# Patient Record
Sex: Female | Born: 1965 | Race: White | Hispanic: No | Marital: Married | Smoking: Never smoker
Health system: Southern US, Community
[De-identification: ages and names within clinical notes are randomized; demographics above are authoritative.]

## PROBLEM LIST (undated history)

## (undated) DIAGNOSIS — E039 Hypothyroidism, unspecified: Secondary | ICD-10-CM

## (undated) DIAGNOSIS — Z800 Family history of malignant neoplasm of digestive organs: Secondary | ICD-10-CM

## (undated) HISTORY — DX: Hypothyroidism, unspecified: E03.9

## (undated) HISTORY — DX: Family history of malignant neoplasm of digestive organs: Z80.0

---

## 1997-10-14 ENCOUNTER — Ambulatory Visit (HOSPITAL_COMMUNITY): Admission: RE | Admit: 1997-10-14 | Discharge: 1997-10-14 | Payer: Self-pay | Admitting: *Deleted

## 2000-10-03 ENCOUNTER — Encounter: Admission: RE | Admit: 2000-10-03 | Discharge: 2000-10-03 | Payer: Self-pay | Admitting: *Deleted

## 2000-10-03 ENCOUNTER — Encounter: Payer: Self-pay | Admitting: *Deleted

## 2001-07-01 ENCOUNTER — Other Ambulatory Visit: Admission: RE | Admit: 2001-07-01 | Discharge: 2001-07-01 | Payer: Self-pay | Admitting: *Deleted

## 2003-12-15 ENCOUNTER — Other Ambulatory Visit: Admission: RE | Admit: 2003-12-15 | Discharge: 2003-12-15 | Payer: Self-pay | Admitting: Family Medicine

## 2003-12-20 ENCOUNTER — Encounter: Admission: RE | Admit: 2003-12-20 | Discharge: 2003-12-20 | Payer: Self-pay | Admitting: Family Medicine

## 2004-01-03 HISTORY — PX: THYROIDECTOMY: SHX17

## 2004-01-05 ENCOUNTER — Encounter (INDEPENDENT_AMBULATORY_CARE_PROVIDER_SITE_OTHER): Payer: Self-pay | Admitting: Specialist

## 2004-01-05 ENCOUNTER — Ambulatory Visit (HOSPITAL_COMMUNITY): Admission: RE | Admit: 2004-01-05 | Discharge: 2004-01-05 | Payer: Self-pay | Admitting: Family Medicine

## 2004-02-07 ENCOUNTER — Observation Stay (HOSPITAL_COMMUNITY): Admission: RE | Admit: 2004-02-07 | Discharge: 2004-02-08 | Payer: Self-pay | Admitting: General Surgery

## 2004-02-07 ENCOUNTER — Encounter (INDEPENDENT_AMBULATORY_CARE_PROVIDER_SITE_OTHER): Payer: Self-pay | Admitting: Specialist

## 2005-04-04 HISTORY — PX: COLONOSCOPY: SHX174

## 2005-05-27 ENCOUNTER — Other Ambulatory Visit: Admission: RE | Admit: 2005-05-27 | Discharge: 2005-05-27 | Payer: Self-pay | Admitting: Family Medicine

## 2005-09-17 DIAGNOSIS — D229 Melanocytic nevi, unspecified: Secondary | ICD-10-CM

## 2005-09-17 HISTORY — DX: Melanocytic nevi, unspecified: D22.9

## 2005-10-28 ENCOUNTER — Ambulatory Visit: Payer: Self-pay | Admitting: Family Medicine

## 2006-10-31 IMAGING — CR DG CHEST 2V
3 series · 3 of 3 positions shown · non-contrast
Comparison: 12/20/03.

CLINICAL DATA: Preoperative respiratory exam for removal of left thyroid nodule.  
 CHEST - TWO VIEW:

[view not recorded (1 of 3)]
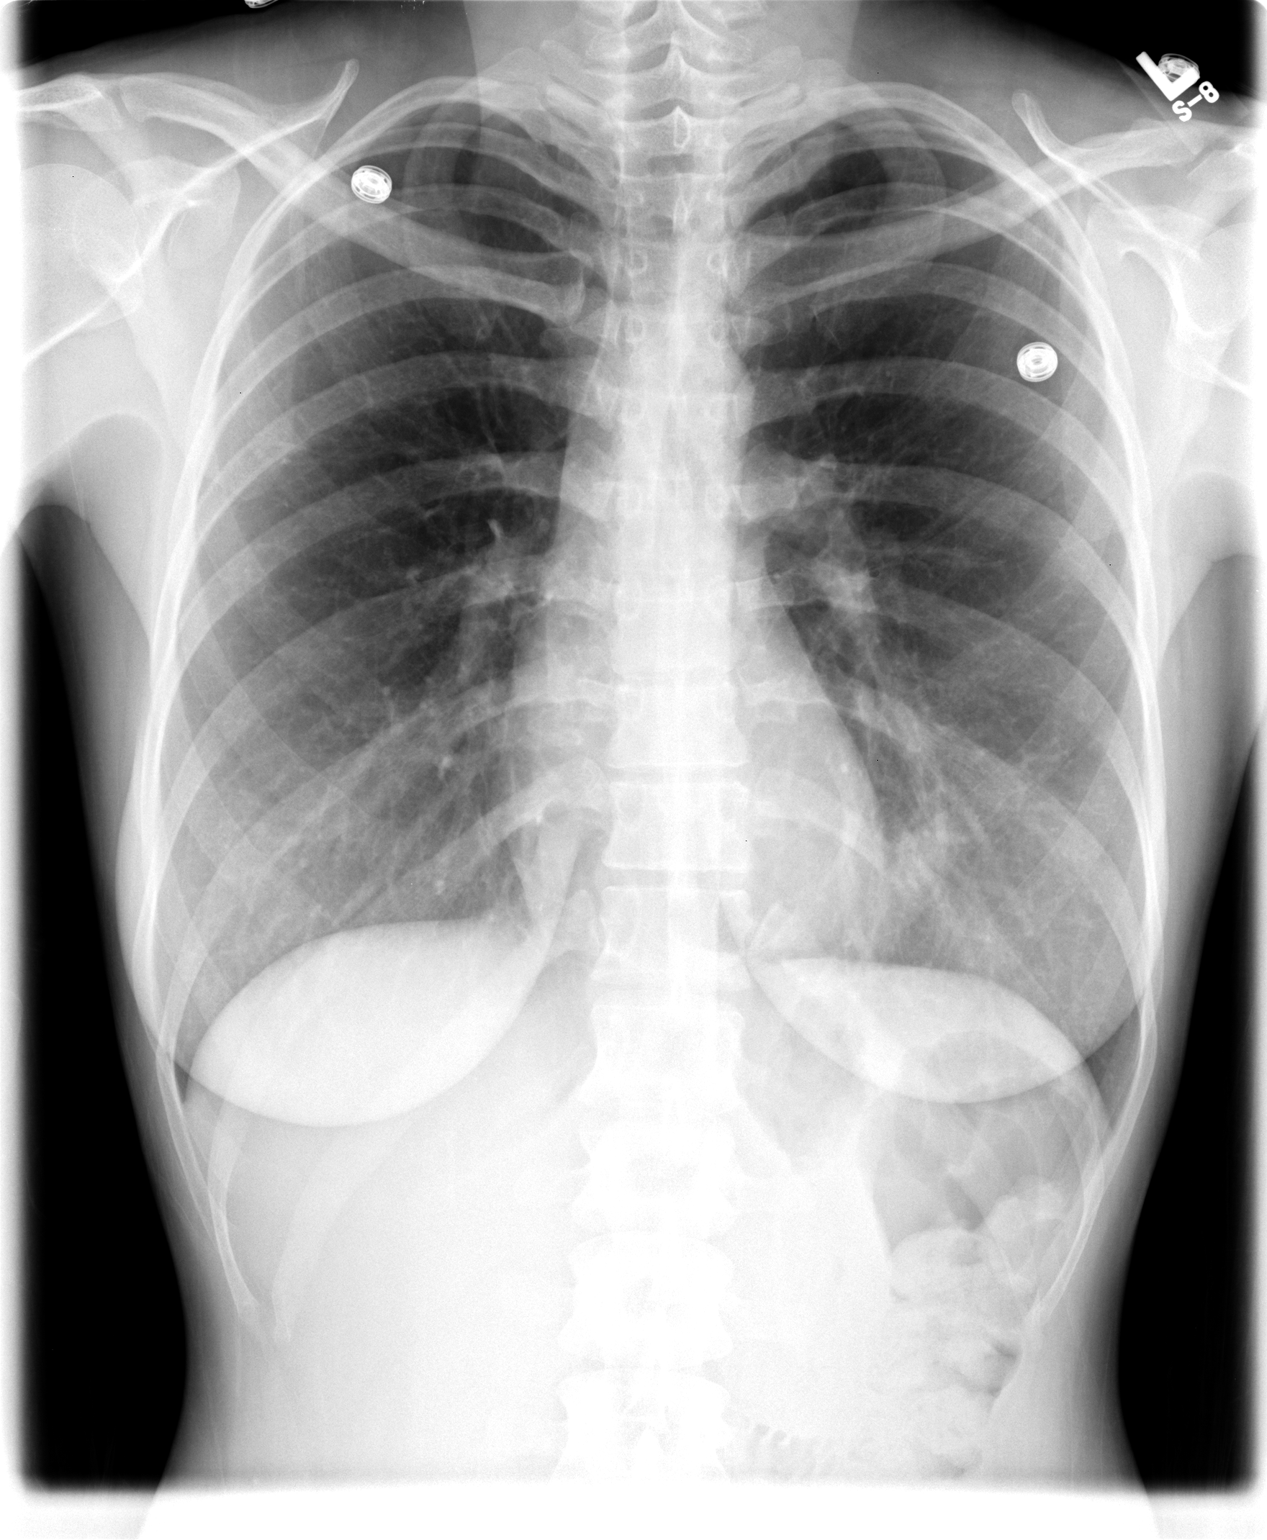

[view not recorded (2 of 3)]
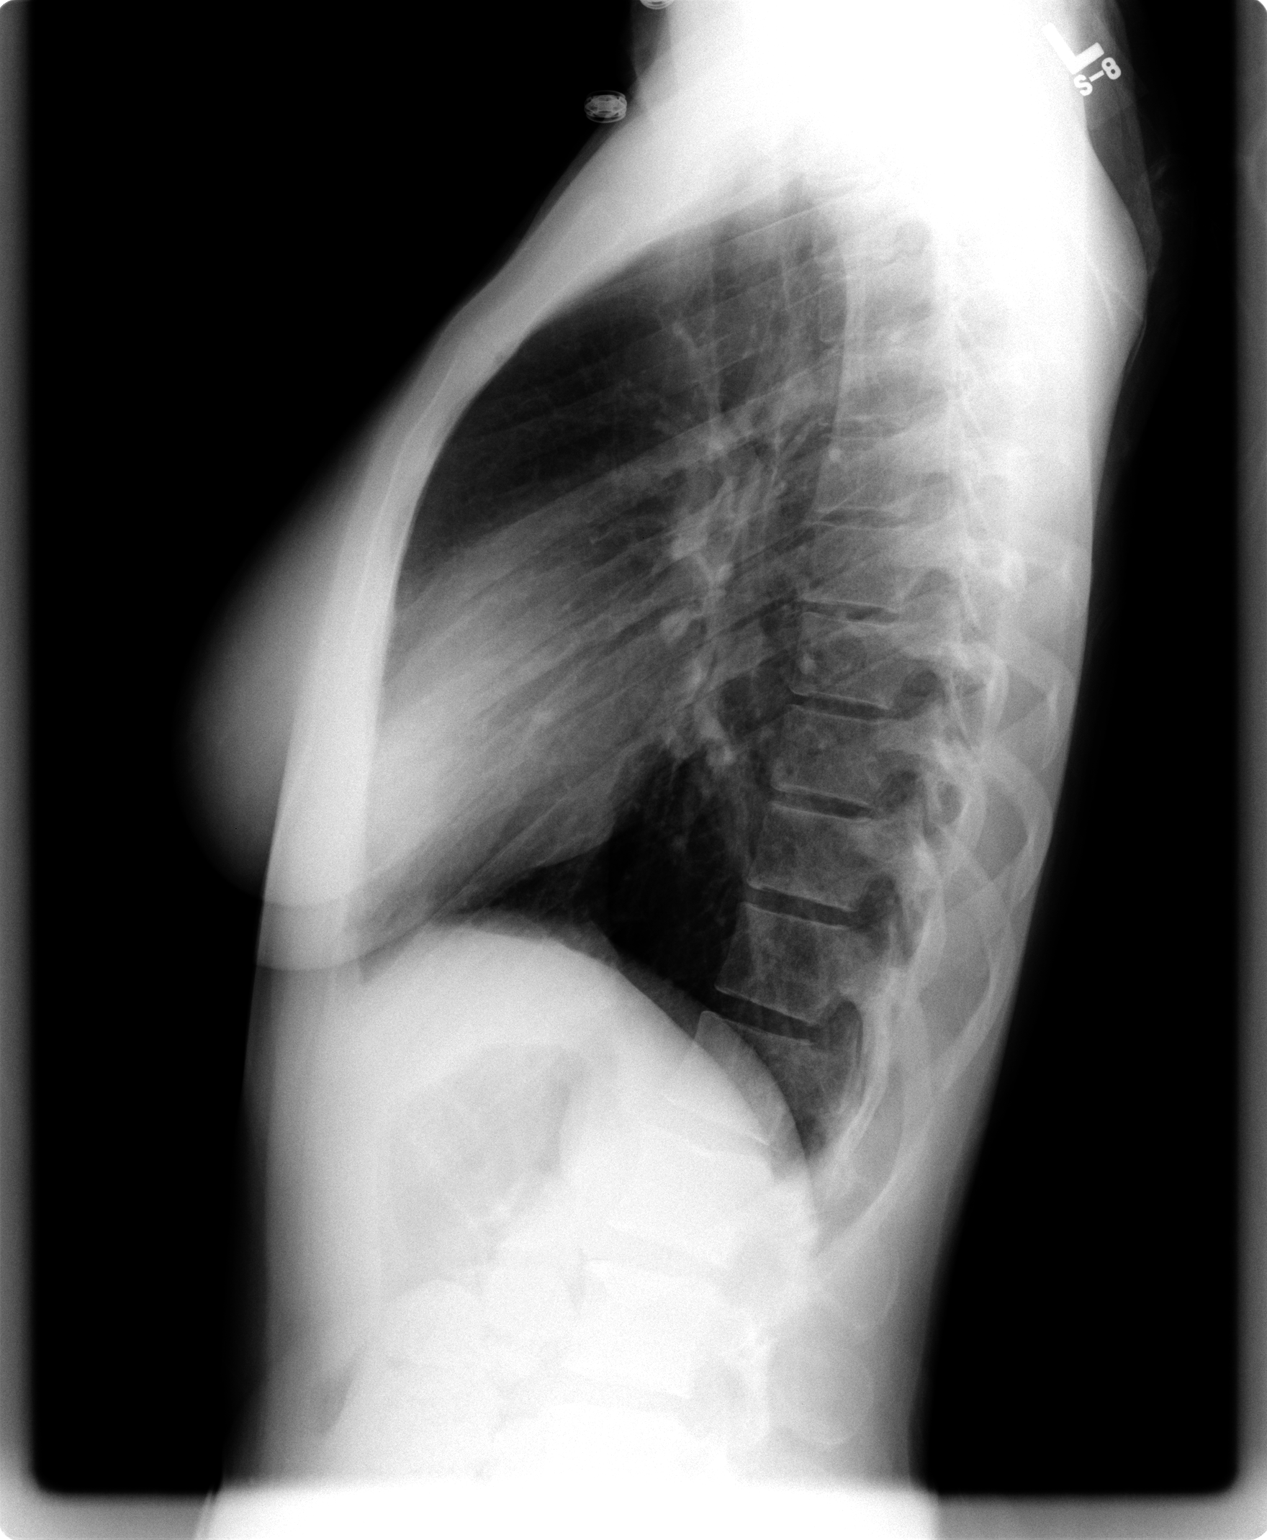

[view not recorded (3 of 3)]
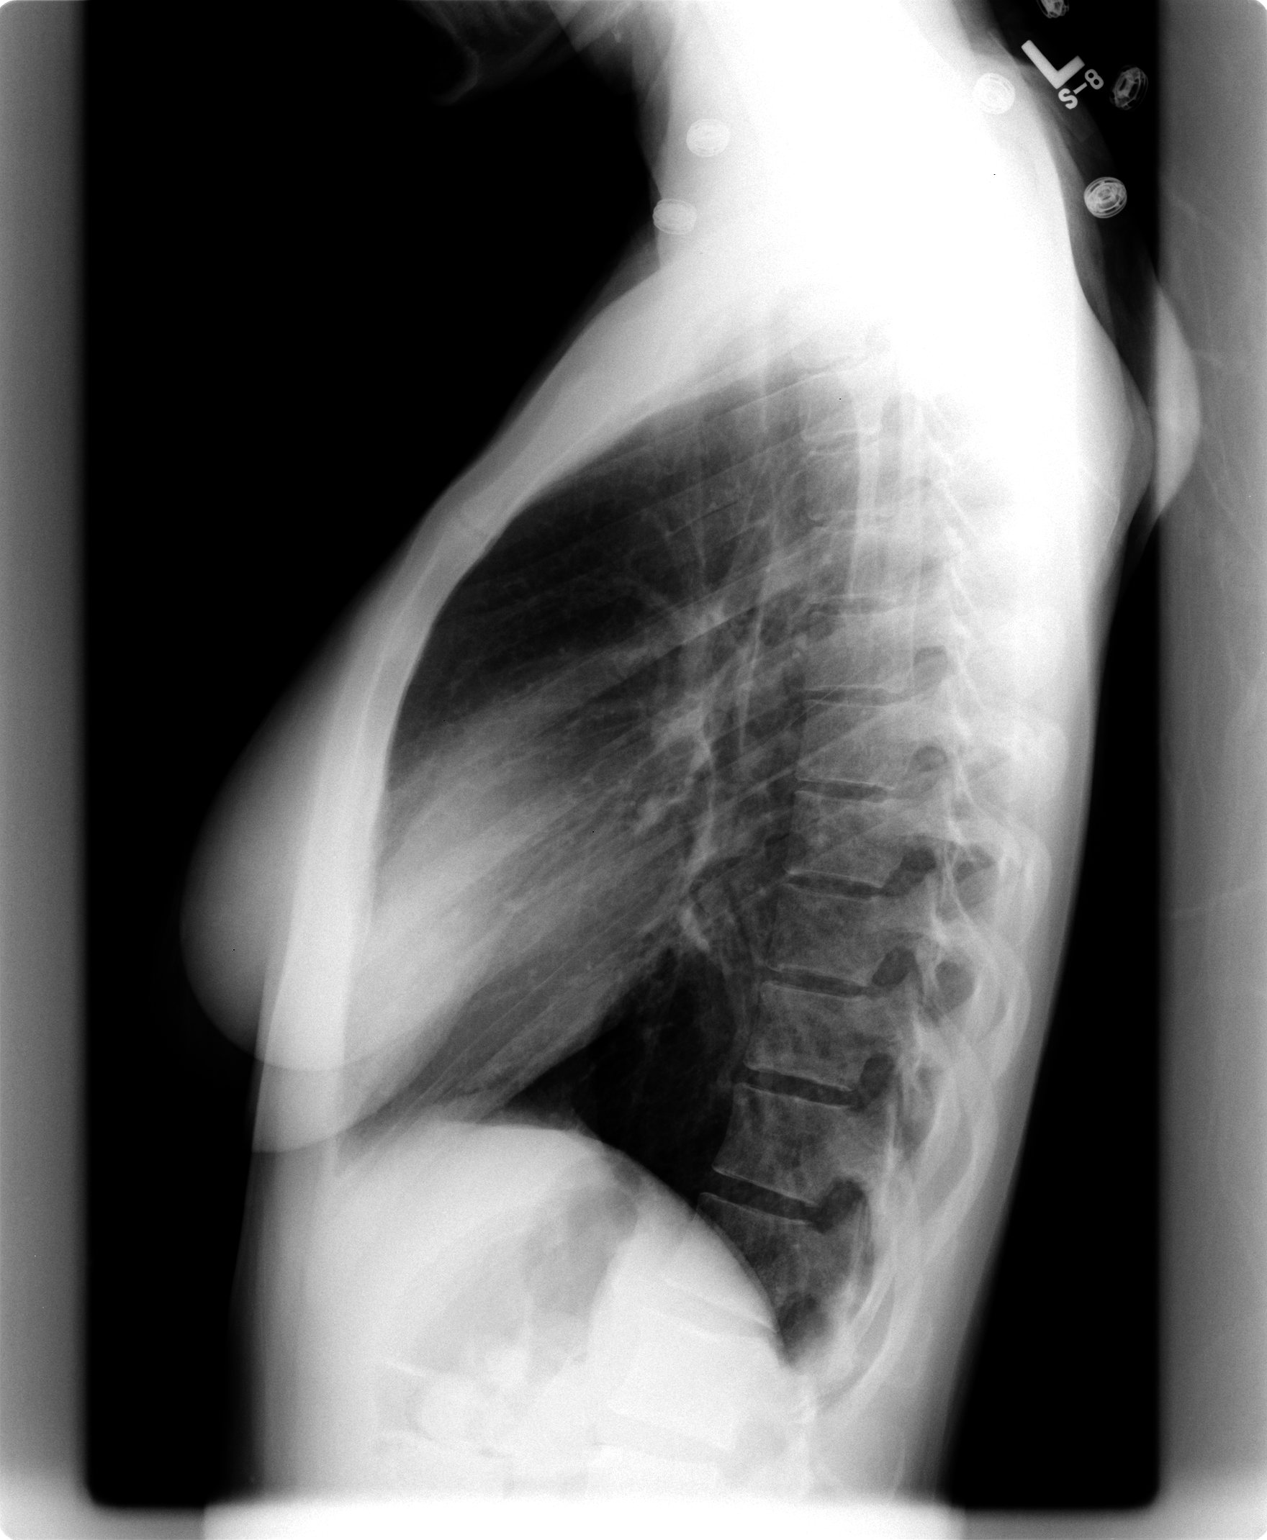

[3 of 3 positions shown; findings below may reference images not displayed]

There is focal scarring versus atelectasis at the left lung base.  No infiltrate, edema, nodule or pleural fluid is seen.  The heart size and mediastinal contours are within normal limits.  Visualized bony thorax is unremarkable.
IMPRESSION: No active disease.  Left basilar scarring/atelectasis.

## 2006-11-26 ENCOUNTER — Ambulatory Visit: Payer: Self-pay | Admitting: Family Medicine

## 2006-12-05 ENCOUNTER — Ambulatory Visit: Payer: Self-pay | Admitting: Hematology and Oncology

## 2006-12-17 LAB — CBC WITH DIFFERENTIAL/PLATELET
BASO%: 0.9 % (ref 0.0–2.0)
Eosinophils Absolute: 0.2 10*3/uL (ref 0.0–0.5)
HGB: 14 g/dL (ref 11.6–15.9)
MCH: 33.9 pg (ref 26.0–34.0)
MONO#: 0.5 10*3/uL (ref 0.1–0.9)
NEUT#: 3.4 10*3/uL (ref 1.5–6.5)
NEUT%: 57.3 % (ref 39.6–76.8)
RBC: 4.13 10*6/uL (ref 3.70–5.32)
RDW: 12.1 % (ref 11.3–14.5)
WBC: 5.9 10*3/uL (ref 3.9–10.0)
lymph#: 1.8 10*3/uL (ref 0.9–3.3)

## 2006-12-17 LAB — COMPREHENSIVE METABOLIC PANEL
AST: 20 U/L (ref 0–37)
Alkaline Phosphatase: 53 U/L (ref 39–117)
BUN: 14 mg/dL (ref 6–23)
Glucose, Bld: 83 mg/dL (ref 70–99)
Potassium: 4 mEq/L (ref 3.5–5.3)
Total Bilirubin: 0.5 mg/dL (ref 0.3–1.2)

## 2006-12-17 LAB — CHCC SMEAR

## 2006-12-18 LAB — FOLATE: Folate: >20 ng/mL

## 2006-12-18 LAB — VITAMIN B12: Vitamin B-12: 608 pg/mL (ref 211–911)

## 2007-02-09 ENCOUNTER — Ambulatory Visit: Payer: Self-pay | Admitting: Hematology and Oncology

## 2007-02-11 LAB — CBC WITH DIFFERENTIAL/PLATELET
BASO%: 0.4 % (ref 0.0–2.0)
Basophils Absolute: 0 10*3/uL (ref 0.0–0.1)
EOS%: 1.1 % (ref 0.0–7.0)
Eosinophils Absolute: 0.1 10*3/uL (ref 0.0–0.5)
LYMPH%: 20.3 % (ref 14.0–48.0)
MCHC: 35.2 g/dL (ref 32.0–36.0)
MCV: 95.3 fL (ref 81.0–101.0)
MONO#: 0.4 10*3/uL (ref 0.1–0.9)
RBC: 3.88 10*6/uL (ref 3.70–5.32)
lymph#: 1.4 10*3/uL (ref 0.9–3.3)

## 2007-02-11 LAB — COMPREHENSIVE METABOLIC PANEL
AST: 22 U/L (ref 0–37)
Alkaline Phosphatase: 47 U/L (ref 39–117)
CO2: 23 mEq/L (ref 19–32)
Potassium: 3.9 mEq/L (ref 3.5–5.3)

## 2007-08-31 ENCOUNTER — Inpatient Hospital Stay (HOSPITAL_COMMUNITY): Admission: AD | Admit: 2007-08-31 | Discharge: 2007-09-03 | Payer: Self-pay | Admitting: Obstetrics and Gynecology

## 2007-09-02 ENCOUNTER — Encounter (INDEPENDENT_AMBULATORY_CARE_PROVIDER_SITE_OTHER): Payer: Self-pay | Admitting: Obstetrics and Gynecology

## 2007-09-07 ENCOUNTER — Encounter: Admission: RE | Admit: 2007-09-07 | Discharge: 2007-10-07 | Payer: Self-pay | Admitting: Obstetrics and Gynecology

## 2007-10-08 ENCOUNTER — Encounter: Admission: RE | Admit: 2007-10-08 | Discharge: 2007-11-03 | Payer: Self-pay | Admitting: Obstetrics and Gynecology

## 2008-01-18 ENCOUNTER — Ambulatory Visit: Payer: Self-pay | Admitting: Family Medicine

## 2008-08-09 ENCOUNTER — Ambulatory Visit: Payer: Self-pay | Admitting: Family Medicine

## 2008-10-04 ENCOUNTER — Ambulatory Visit: Payer: Self-pay | Admitting: Family Medicine

## 2008-12-22 ENCOUNTER — Ambulatory Visit: Payer: Self-pay | Admitting: Family Medicine

## 2009-01-24 ENCOUNTER — Ambulatory Visit: Payer: Self-pay | Admitting: Family Medicine

## 2009-03-08 ENCOUNTER — Ambulatory Visit: Payer: Self-pay | Admitting: Family Medicine

## 2009-03-08 ENCOUNTER — Encounter: Admission: RE | Admit: 2009-03-08 | Discharge: 2009-03-08 | Payer: Self-pay | Admitting: Family Medicine

## 2009-04-10 ENCOUNTER — Ambulatory Visit: Payer: Self-pay | Admitting: Family Medicine

## 2009-10-31 ENCOUNTER — Ambulatory Visit (HOSPITAL_COMMUNITY): Admission: RE | Admit: 2009-10-31 | Discharge: 2009-10-31 | Payer: Self-pay | Admitting: Obstetrics and Gynecology

## 2009-11-02 DEATH — deceased

## 2010-03-08 ENCOUNTER — Ambulatory Visit
Admission: RE | Admit: 2010-03-08 | Discharge: 2010-03-08 | Payer: Self-pay | Source: Home / Self Care | Attending: Family Medicine | Admitting: Family Medicine

## 2010-03-24 ENCOUNTER — Encounter: Payer: Self-pay | Admitting: Family Medicine

## 2010-03-25 ENCOUNTER — Encounter: Payer: Self-pay | Admitting: Family Medicine

## 2010-03-27 ENCOUNTER — Encounter
Admission: RE | Admit: 2010-03-27 | Discharge: 2010-03-27 | Payer: Self-pay | Source: Home / Self Care | Attending: Family Medicine | Admitting: Family Medicine

## 2010-03-27 ENCOUNTER — Ambulatory Visit
Admission: RE | Admit: 2010-03-27 | Discharge: 2010-03-27 | Payer: Self-pay | Source: Home / Self Care | Attending: Family Medicine | Admitting: Family Medicine

## 2010-05-16 ENCOUNTER — Ambulatory Visit (INDEPENDENT_AMBULATORY_CARE_PROVIDER_SITE_OTHER): Payer: 59 | Admitting: Family Medicine

## 2010-05-16 DIAGNOSIS — E039 Hypothyroidism, unspecified: Secondary | ICD-10-CM

## 2010-05-16 DIAGNOSIS — J309 Allergic rhinitis, unspecified: Secondary | ICD-10-CM

## 2010-05-16 DIAGNOSIS — E559 Vitamin D deficiency, unspecified: Secondary | ICD-10-CM

## 2010-05-18 LAB — CBC
Hemoglobin: 14.4 g/dL (ref 12.0–15.0)
MCH: 34.2 pg — ABNORMAL HIGH (ref 26.0–34.0)
WBC: 4.8 10*3/uL (ref 4.0–10.5)

## 2010-07-17 NOTE — Op Note (Signed)
Judy Mckenzie, Judy Mckenzie              ACCOUNT NO.:  [id number]   MEDICAL RECORD NO.:  [id number]          PATIENT TYPE:  INP   LOCATION:  9174                          FACILITY:  [facility]   PHYSICIAN:  Guy Sandifer. Henderson Cloud, M.D. DATE OF BIRTH:  July 01, 1965   DATE OF PROCEDURE:  09/01/2007  DATE OF DISCHARGE:                               OPERATIVE REPORT   PROCEDURE:  Vacuum extraction.   INDICATIONS AND PROCEDURE:  This patient is a 45 year old married white  female G2 P0 with an EDC of August 27, 2007 who was admitted for induction  of labor on August 31, 2007.  She proceeds to complete and pushing.  Vertex is at the +1 station when she begins the second stage.  After  approximately 3-1/2 hours of pushing, the vertex is +3.  There is good  movement with the push, but the patient is unable to push the vertex via  the perineal body.  Fetal heart tones remain reactive.  Due to lack of  progress, vacuum extraction is discussed with the patient and her  husband.  One in 40,000 risk of severe morbidity or mortality is  reviewed.  All questions were answered.  The patient had been straight  catheterized within the last approximately 30 minutes.  She is set up  and prepped for delivery.  A thin meconium had been noted over the last  several hours.   A kiwi vacuum extractor was placed on the vertex.  There are no pop  offs.  Mild-to-moderate traction is used.  With the second contraction,  the head crowns nicely.  It has then delivered with essentially no  further traction on the vacuum extractor with the patient's own pushing.  After delivery of the vertex, the oronasopharynx were suctioned with the  DeLee suction.  A loose nuchal cord is noted and the baby is delivered.  The cord is reduced after delivery.  Cord is immediately clamped and  cut, and the baby's was handed to the waiting pediatrics team.  No  meconium was noted below the vocal cords by the neonatologist.  Viable  female infant, Apgars of 3  and 9 are obtained.  Birth weight of 7 pounds  13 ounces, and an arterial cord pH of 7.29 is noted.   Placenta is delivered intact and three vessels are noted.  It is sent to  pathology.  Inspection reveals a second-degree midline laceration as  well as a right sulcus laceration, approximately 4 cm in length.  The  patient is then given a sit up dose of her epidural anesthetic from the  anesthesia.  This allows adequate comfort and visualization, and the  sulcus tear is repaired in a running locking fashion with 2-0 Rapide  suture.  The episiotomy is also repaired in a standard fashion.  One  last check reveals the uterus to be involuting well, and there are no  sponges left in the vagina.  The patient and baby are stable in the  labor and delivery room.   ESTIMATED BLOOD LOSS:  500 mL.      Guy Sandifer Henderson Cloud, M.D.  Electronically Signed     JET/MEDQ  D:  09/02/2007  T:  09/02/2007  Job:  [id number]

## 2010-07-20 NOTE — Op Note (Signed)
NAMEBURNA, ATLAS NO.:  [id number]   MEDICAL RECORD NO.:  [id number]          PATIENT TYPE:  OBV   LOCATION:  0373                         FACILITY:  [facility]   PHYSICIAN:  Sharlet Salina T. Hoxworth, M.D.DATE OF BIRTH:  1965/12/25   DATE OF PROCEDURE:  02/07/2004  DATE OF DISCHARGE:                                 OPERATIVE REPORT   PREOPERATIVE DIAGNOSES:  Left thyroid nodule.   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   PROCEDURE:  Total thyroidectomy.   SURGEON:  Lorne Skeens. Hoxworth, M.D.   ASSISTANT:  Lebron Conners, M.D.   ANESTHESIA:  General.   BRIEF HISTORY:  Zanobia Griebel is a 45 year old white female recently found  on physical exam to have an approximately 2 cm nodule in the left lobe of  the thyroid. 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  We have elected to proceed with left  thyroid lobectomy and possible total thyroidectomy. 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 She is now brought to the operating room for this  procedure.   DESCRIPTION OF PROCEDURE:  The patient was brought to the operating room,  placed in supine position on the operating table and general endotracheal  anesthesia was induced. She was carefully positioned with her neck extended  and the neck and upper chest sterilely prepped and draped. A transverse  incision was made in a skin crease two fingerbreadths above the sternal  notch and dissection carried down through the subcutaneous tissue and the  platysma. The subplatysmal flaps were raised superiorly to the thyroid  cartilage, inferiorly toward the sternal notch and laterally at the  sternocleidomastoid muscles.  A Mahorner retractor was placed and the strap  muscles were divided in the midline.  The anterior surface of the left lobe  was then exposed with blunt dissection and the nodule was immediately  apparently which seemed a little less than 2 cm and was quite soft.  The  thyroid gland on the left was mobilized with careful blunt dissection.  The  middle thyroid vein was divided between clips. The superior pole was  dissected and superior pole vessels divided between clamps, tied and  clipped.  The gland was then reflected up medially and we felt that we could  identify the inferior parathyroid gland which was protected. Dissection was  carefully carried out in the tracheoesophageal groove and the recurrent  laryngeal nerve on the left was clearly identified and its course traced up  toward the larynx and dissection kept well medial to this.  Individual  branches of the inferior thyroid artery were then dissected and clipped and  the gland mobilized back to the trachea and suspensory ligament of Allyson Sabal  which was mobilized with cautery. The isthmus was fully mobilized and the  nodule contained well within the gland toward the isthmus. The isthmus was  clamped,  divided and the left lobe sent for frozen section. This did raise  significant suspicion for follicular, ovarian or papillary carcinoma and I  elected to proceed with total thyroidectomy as planned. The right lobe was  exposed identically.  It was quite small.  Upper pole vessels were tied and  then doubly clipped and divided, upper pole fully mobilized. Again noted was  a probable inferior parathyroid gland that was dissected off the thyroid  capsule and preserved. The dissection was kept really just inside the  capsule of the thyroid gland on this side and the recurrent laryngeal nerve  was not exposed but we felt we were well medial to this.  The gland was then  mobilized back in a similar fashion towards  the suspensory ligament which  was divided with cautery and the specimen removed.  Complete hemostasis was  assured in the operative site.  The strap muscles were then closed in the  midline interrupted 4-0 Vicryl.  The plastyma was closed with interrupted 4-  0 Vicryl and the skin with subcuticular 5-0 Monocryl and Steri-Strips.  Sponge, needle and instrument counts were correct.  Dry sterile dressings  were applied and the patient taken to recovery in good condition.     Benj   BTH/MEDQ  D:  02/07/2004  T:  02/07/2004  Job:  [id number]

## 2010-10-09 ENCOUNTER — Other Ambulatory Visit: Payer: Self-pay | Admitting: Physician Assistant

## 2010-11-29 LAB — CBC
MCHC: 34.5
Platelets: 117 — ABNORMAL LOW
Platelets: 127 — ABNORMAL LOW
RBC: 3.37 — ABNORMAL LOW
RBC: 3.34 — ABNORMAL LOW
RDW: 14
WBC: 18.9 — ABNORMAL HIGH

## 2010-12-10 ENCOUNTER — Ambulatory Visit (INDEPENDENT_AMBULATORY_CARE_PROVIDER_SITE_OTHER): Payer: 59 | Admitting: Medical

## 2010-12-10 ENCOUNTER — Encounter: Payer: Self-pay | Admitting: Medical

## 2010-12-10 VITALS — BP 118/70 | HR 60 | Temp 98.1°F | Resp 18 | Ht 63.0 in | Wt 127.0 lb

## 2010-12-10 DIAGNOSIS — R053 Chronic cough: Secondary | ICD-10-CM | POA: Insufficient documentation

## 2010-12-10 DIAGNOSIS — Z800 Family history of malignant neoplasm of digestive organs: Secondary | ICD-10-CM

## 2010-12-10 DIAGNOSIS — R05 Cough: Secondary | ICD-10-CM | POA: Insufficient documentation

## 2010-12-10 MED ORDER — BENZONATATE 200 MG PO CAPS: 200.0000 mg | ORAL_CAPSULE | Freq: Three times a day (TID) | ORAL | Status: AC | PRN

## 2010-12-10 MED ORDER — CETIRIZINE HCL 10 MG PO TABS: 10.0000 mg | ORAL_TABLET | Freq: Every day | ORAL | Status: DC

## 2010-12-10 MED ORDER — OMEPRAZOLE MAGNESIUM 20 MG PO TBEC: 20.0000 mg | DELAYED_RELEASE_TABLET | Freq: Every day | ORAL | Status: DC

## 2010-12-10 NOTE — Patient Instructions (Signed)
Use Tessalon Perles up to 3 times daily for cough suppression.  Begin OTC Zyrtec at bedtime once daily for post nasal drip.  Begin OTC Prilosec samples one time daily 45 minutes before breakfast along with your thyroid medication.    Lets recheck in 3-4 weeks.

## 2010-12-10 NOTE — Progress Notes (Signed)
  Subjective:   HPI  Judy Mckenzie is a 45 y.o. female who presents for c/o cough x 1 month.  She notes in the past that every time she gets a cold she has a prolonged cough.  She denies hx/o asthma, smoking, heart disease, lung disease, allergies, or GERD.  She does note some recent post nasal drip, but otherwise feels fine.  Her daughter had strep throat a month ago, but no other sick contacts.  Took some Nyquil which suppressed cough, but made her feel woozy.  No other aggravating or relieving factors.    No other c/o.  The following portions of the patient's history were reviewed and updated as appropriate: allergies, current medications, past family history, past medical history, past social history, past surgical history and problem list.  Past Medical History  Diagnosis Date  . Hypothyroidism     hx/o thyroid cancer, hx/o thyroidectomy  . Vitamin D deficiency   . Family history of colon cancer     No Known Allergies  Current medications: Synthroid daily.  Review of Systems Constitutional: +fatigue; denies fever, chills, sweats, unexpected weight change ENT: +post nasal drip;  no runny nose, ear pain, sore throat Cardiology: denies chest pain, palpitations, edema Respiratory: denies shortness of breath, wheezing Gastroenterology: denies abdominal pain, nausea, vomiting, diarrhea, constipation, difficulty swallowing Hematology: denies bleeding or bruising problems Musculoskeletal: +mild aches; mild upper back pain at times Urology: denies dysuria, difficulty urinating, hematuria, urinary frequency, urgency Ext: occasional right ankle swelling if having right knee pain or if standing for prolonged periods     Objective:   Physical Exam  General appearance: alert, no distress, WD/WN, white female Skin: unremarkable HEENT: normocephalic, sclerae anicteric, TMs pearly, nares patent, no discharge or erythema, pharynx with mild post nasal drip Oral cavity: MMM, no  lesions Neck: supple, no lymphadenopathy, no thyromegaly, no masses Heart: RRR, normal S1, S2, no murmurs Lungs: CTA bilaterally, no wheezes, rhonchi, or rales Abdomen: +bs, soft, non tender, non distended, no masses, no hepatomegaly, no splenomegaly Pulses: 2+ symmetric, upper and lower extremities, normal cap refill   Assessment :    Encounter Diagnoses  Name Primary?  . Chronic cough Yes  . Family history of colon cancer      Plan:  Chronic cough - discussed long list of possible causes.  Will try OTC Zyrtec nightly and begin Prilosec in the mornings.  Tessalon Perles prn for cough suppression.  If not improving or worse over the next 3-4 wk, will then consider appropriate workup.  I reviewed last labs in chart from 1/12 and 3/12, and reviewed prior CXR from 1/12.  Of note, she had similar cough in March that cleared with Jerilynn Som.  Last colonoscopy was 2007.  I advised she contact Dr. Kenna Gilbert office for advise on next screening colonoscopy given mother and aunt's family history of colon cancer at young age.

## 2011-01-08 ENCOUNTER — Telehealth: Payer: Self-pay | Admitting: Family Medicine

## 2011-01-09 ENCOUNTER — Other Ambulatory Visit: Payer: Self-pay | Admitting: *Deleted

## 2011-01-09 DIAGNOSIS — E039 Hypothyroidism, unspecified: Secondary | ICD-10-CM

## 2011-01-10 ENCOUNTER — Telehealth: Payer: Self-pay | Admitting: *Deleted

## 2011-01-10 MED ORDER — LEVOTHYROXINE SODIUM 100 MCG PO TABS: 100.0000 ug | ORAL_TABLET | Freq: Every day | ORAL | Status: DC

## 2011-01-10 NOTE — Telephone Encounter (Signed)
This was already taken care of by [place] in a separate message (samples given)

## 2011-01-10 NOTE — Telephone Encounter (Signed)
Patient called and asked for 1 week worth of synthroid samples as she is going out of town and her shipment from [employer] will not be at her home until next week. Patient given #7 samples and she is now scheduled for a TSH lab visit on 01/29/11 @ 9:30am, needed per chart.

## 2011-01-18 ENCOUNTER — Encounter: Payer: Self-pay | Admitting: Family Medicine

## 2011-01-29 ENCOUNTER — Other Ambulatory Visit: Payer: 59

## 2011-01-29 DIAGNOSIS — E039 Hypothyroidism, unspecified: Secondary | ICD-10-CM

## 2011-01-29 LAB — TSH: TSH: 1.615 u[IU]/mL (ref 0.350–4.500)

## 2011-01-30 ENCOUNTER — Encounter: Payer: Self-pay | Admitting: Family Medicine

## 2011-03-01 ENCOUNTER — Encounter: Payer: Self-pay | Admitting: Medical

## 2011-03-01 ENCOUNTER — Ambulatory Visit (INDEPENDENT_AMBULATORY_CARE_PROVIDER_SITE_OTHER): Payer: 59 | Admitting: Medical

## 2011-03-01 VITALS — BP 100/70 | HR 68 | Temp 98.4°F | Resp 16 | Wt 130.0 lb

## 2011-03-01 DIAGNOSIS — J40 Bronchitis, not specified as acute or chronic: Secondary | ICD-10-CM

## 2011-03-01 MED ORDER — HYDROCODONE-HOMATROPINE 5-1.5 MG/5ML PO SYRP: 5.0000 mL | ORAL_SOLUTION | Freq: Four times a day (QID) | ORAL | Status: AC | PRN

## 2011-03-01 MED ORDER — AZITHROMYCIN 500 MG PO TABS: 500.0000 mg | ORAL_TABLET | Freq: Every day | ORAL | Status: AC

## 2011-03-01 NOTE — Progress Notes (Signed)
Subjective:   HPI  Judy Mckenzie is a 45 y.o. female who presents with three-week history of cough, had a cold last week, but now it seems like symptoms have moved into her chest. She notes pain with deep breath, chest congestion, pain around her right chest wall, nausea, sore throat, fatigue. She denies fever, vomiting, diarrhea, headache. Using [employer], NyQuil, and Mucinex.  Of note she had a colonoscopy this past week.  No other aggravating or relieving factors.    No other c/o.  The following portions of the patient's history were reviewed and updated as appropriate: allergies, current medications, past family history, past medical history, past social history, past surgical history and problem list.  Past Medical History  Diagnosis Date  . Hypothyroidism     hx/o thyroid cancer, hx/o thyroidectomy  . Vitamin D deficiency   . Family history of colon cancer     Review of Systems Constitutional: -fever, -chills, -sweats, -unexpected -weight change,-fatigue ENT: -runny nose, -ear pain, -sore throat Cardiology:  +chest pain, -palpitations, -edema Respiratory: -cough, -shortness of breath, -wheezing Gastroenterology: -abdominal pain, -nausea, -vomiting, -diarrhea, -constipation Hematology: -bleeding or bruising problems Musculoskeletal: -arthralgias, -myalgias, -joint swelling, -back pain Ophthalmology: -vision changes Urology: -dysuria, -difficulty urinating, -hematuria, -urinary frequency, -urgency Neurology: -headache, -weakness, -tingling, -numbness   Objective:   Filed Vitals:   03/01/11 1644  BP: 100/70  Pulse: 68  Temp: 98.4 F (36.9 C)  Resp: 16    General appearance: Alert, WD/WN, no distress, somewhat ill appearing                             Skin: warm, no rash, no diaphoresis                           Head: no sinus tenderness                            Eyes: conjunctiva normal, corneas clear, PERRLA                            Ears: pearly TMs,  external ear canals normal                          Nose: septum midline, turbinates swollen, with erythema and clear discharge             Mouth/throat: MMM, tongue normal, mild pharyngeal erythema                           Neck: supple, no adenopathy, no thyromegaly, nontender                          Heart: RRR, normal S1, S2, no murmurs                         Lungs: +bronchial breath sounds, no rhonchi, no wheezes, no rales                Extremities: no edema, nontender     Assessment and Plan:   Encounter Diagnosis  Name Primary?  . Bronchitis Yes   Prescription given today for Hydromet and azithromycin as below.  Discussed diagnosis and treatment of bronchitis.  Suggested symptomatic OTC remedies for cough and congestion.  Nasal saline spray for nasal congestion.  Tylenol or Ibuprofen OTC for fever and malaise.  Call/return in 2-3 days if symptoms are worse or not improving.  If not some better by Monday, consider chest x-ray.

## 2011-03-01 NOTE — Patient Instructions (Signed)
Acute Bronchitis Bronchitis is a problem of the air tubes leading to your lungs. Acute means the illness started quickly. In this condition, the lining of those tubes becomes puffy (swollen) and can leak fluid. This makes it harder for air to get in and out of your lungs. You may cough a lot. This is because the air tubes are narrow. Bronchitis is most often caused by a virus. Medicines that kill germs (antibiotics) may be needed with germ (bacteria) infections for people who:  Smoke.   Have lasting (chronic) lung problems.   Are elderly.  HOME CARE  Rest.   Drink enough water and fluids to keep the pee clear or pale yellow.   Only take medicine as told by your doctor.   Medicines may be prescribed that will open up the airways. This will help make breathing easier.   Bronchitis usually gets better on its own in a few days.  Recovery from some problems (symptoms) of bronchitis may be slow. You should start feeling a little better after 2 to 3 days. Coughing may last for 3 to 4 weeks. GET HELP RIGHT AWAY IF:  You or your child has a temperature by mouth above 101, not controlled by medicine.   Chills or chest pain develops.   You or your child develops very bad shortness of breath.   There is bloody saliva mixed with mucus (sputum).   You or your child throws up (vomits) often, loses too much fluid (dehydration), feels faint, or has a very bad headache.   You or your child does not improve after 1 week of treatment.  MAKE SURE YOU:   Understand these instructions.   Will watch this condition.   Will get help right away if you or your child is not doing well or gets worse.  Document Released: 08/07/2007 Document Re-Released: 05/15/2009 [employer] Patient Information 2011 [employer].  

## 2011-03-12 ENCOUNTER — Ambulatory Visit: Payer: 59 | Admitting: Medical

## 2011-03-14 ENCOUNTER — Encounter: Payer: Self-pay | Admitting: Family Medicine

## 2011-03-14 ENCOUNTER — Ambulatory Visit (INDEPENDENT_AMBULATORY_CARE_PROVIDER_SITE_OTHER): Payer: 59 | Admitting: Family Medicine

## 2011-03-14 ENCOUNTER — Ambulatory Visit
Admission: RE | Admit: 2011-03-14 | Discharge: 2011-03-14 | Disposition: A | Discharge status: Home / Self Care | Payer: 59 | Source: Ambulatory Visit | Attending: Family Medicine | Admitting: Family Medicine

## 2011-03-14 DIAGNOSIS — J209 Acute bronchitis, unspecified: Secondary | ICD-10-CM

## 2011-03-14 DIAGNOSIS — R05 Cough: Secondary | ICD-10-CM

## 2011-03-14 DIAGNOSIS — R079 Chest pain, unspecified: Secondary | ICD-10-CM

## 2011-03-14 MED ORDER — LEVOFLOXACIN 500 MG PO TABS: 500.0000 mg | ORAL_TABLET | Freq: Every day | ORAL | Status: AC

## 2011-03-14 NOTE — Progress Notes (Signed)
Chief complaint: saw Vincenza Hews 12/28 for bronchitis, still having cough and pain over right breast area. Cough is sometimes productive, sometimes not. Mucus is clear and sometimes greenish  Still has discomfort over right chest, when she takes a deep breath.  It isn't stabbing and sharp like it was a few weeks ago. She was treated with 3 day course of azithromycin. Denies fevers.  Has some slight runny nose. Had been off of Claritin for a while, but restarted 3 days ago.  Having postnasal drip and throat clearing last night.  Denies sinus pain/pressure.  Cough is often dry, sometimes small amount of green or clear.  Took Mucinex prior to seeing Vincenza Hews.  Was rx'd a cough syrup by Vincenza Hews, but didn't need it after antibiotics, until last night, with good results.   Also had cough in November, felt like may have been related to reflux.  Treated with antihistamines and PPI and cough resolved, until recurred with recent illness (December).  Past Medical History  Diagnosis Date  . Hypothyroidism     hx/o thyroid cancer, hx/o thyroidectomy  . Vitamin D deficiency   . Family history of colon cancer     Past Surgical History  Procedure Date  . Thyroidectomy 01/2004    Dr. Johna Sheriff  . Colonoscopy 04/2005    Dr. Loreta Ave; screening due to family history    History   Social History  . Marital Status: Married    Spouse Name: N/A    Number of Children: N/A  . Years of Education: N/A   Occupational History  . Not on file.   Social History Main Topics  . Smoking status: Never Smoker   . Smokeless tobacco: Never Used  . Alcohol Use: No  . Drug Use: No  . Sexually Active: Not on file   Other Topics Concern  . Not on file   Social History Narrative  . No narrative on file    Family History  Problem Relation Age of Onset  . Cancer Mother     died of lung cancer, long term smoker, colon dx in 55s  . Thyroid disease Father   . Heart disease Father   . Cancer Maternal Aunt     colon  . Cancer  Maternal Grandmother     colon  . Cancer Paternal Grandfather     skin cancer   Current Outpatient Prescriptions on File Prior to Visit  Medication Sig Dispense Refill  . levothyroxine (SYNTHROID) 100 MCG tablet Take 1 tablet (100 mcg total) by mouth daily.  7 tablet  0  . omeprazole (PRILOSEC OTC) 20 MG tablet Take 1 tablet (20 mg total) by mouth daily.  28 tablet  1    No Known Allergies  ROS: No nausea, vomiting, diarrhea, skin rash, or myalgias.  PHYSICAL EXAM: Tearful, frustrated, rare cough, in no distress. BP 100/60  Pulse 68  Temp(Src) 98.3 F (36.8 C) (Oral)  Ht 5\' 3"  (1.6 m)  Wt 133 lb (60.328 kg)  BMI 23.56 kg/m2 HEENT:  PERRL, EOMI, conjunctiva clear. Nasal mucosa moderately edematous, pale, clear mucus Sinuses nontender. OP with cobblestoning posteriorly Neck: no lymphadenopathy Heart: regular rate and rhythm without murmur Lungs: clear, good air movement.  Rare squeak/wheeze more in L upper lungs, rare on right Abdomen: soft, nontender  ASSESSMENT/PLAN: 1. Chest pain  DG Chest 2 View  2. Cough  DG Chest 2 View  3. Bronchitis, acute  levofloxacin (LEVAQUIN) 500 MG tablet    Cough---suspicious for bronchitis.  Will check  CXR given R sided chest pain and ongoing cough/length of symptoms.  Definitely has component of postnasal drip, although no evidence of sinus infection Since treated with azithro a few weeks ago, change ABX.  levaquin 500mg  x 7 days Continue with Claritin Add Mucinex  Ibuprofen, aleve OR aspirin as needed for chest wall pain  Continue PPI--at some point, once cough has resolved, then try stopping.  May restart if cough recurs.

## 2011-03-14 NOTE — Patient Instructions (Signed)
Cough---suspicious for bronchitis.  Will check CXR given R sided chest pain and ongoing cough/length of symptoms.  Definitely has component of postnasal drip, although no evidence of sinus infection Since treated with azithro a few weeks ago, change ABX.  levaquin 500mg  x 7 days Continue with Claritin Add Mucinex  Ibuprofen, aleve OR aspirin as needed for chest wall pain  Continue PPI (ie Prilosec or Prevacid)--at some point, once cough has resolved, then try stopping.  May restart if/when cough recurs.

## 2011-05-27 ENCOUNTER — Encounter: Payer: Self-pay | Admitting: Family Medicine

## 2011-05-27 ENCOUNTER — Ambulatory Visit (INDEPENDENT_AMBULATORY_CARE_PROVIDER_SITE_OTHER): Payer: 59 | Admitting: Family Medicine

## 2011-05-27 ENCOUNTER — Ambulatory Visit: Payer: 59 | Admitting: Medical

## 2011-05-27 VITALS — BP 98/60 | HR 68 | Temp 97.9°F | Ht 63.0 in | Wt 135.0 lb

## 2011-05-27 DIAGNOSIS — J321 Chronic frontal sinusitis: Secondary | ICD-10-CM

## 2011-05-27 MED ORDER — AMOXICILLIN-POT CLAVULANATE 875-125 MG PO TABS: 1.0000 | ORAL_TABLET | Freq: Two times a day (BID) | ORAL | Status: AC

## 2011-05-27 NOTE — Patient Instructions (Signed)
Decongestants (ie Sudafed), Expectorant (ie Mucinex or Robitussin) to thin out the phlegm.  You may continue to use cough suppressant as needed (DM--dextromethorphan, or other hydrocodone-containing syrup) Try sinus rinse kit or Neti-pot.  Keep head of bed elevated

## 2011-05-27 NOTE — Progress Notes (Signed)
Chief complaint: Nasal Congestion x 3 weeks, green mucus now turning darker. HA, having "metallic" taste her mouth. Fatigue and chills  HPI:  Began with runny nose, sneezing 3-4 weeks ago.  Runny nose stopped, but then mucus turned dark green, thick drainage down back of throat, coughing.  Nasal mucus and phlegm is dark green.  Last week had chills, achey, fatigued.  +nausea.  Had to take cough syrup due to cough, and tylenol for pain, headache.  Headache is at temples, and across whole forehead.Hasn't tried other OTC meds.  Daughter had sinus and ear infection 3 weeks ago.  Patient finally stopped coughing after prolonged illness from October through January, after a course of Levaquin.  Was fine until she caught a cold 3 weeks ago.  Past Medical History  Diagnosis Date  . Hypothyroidism     hx/o thyroid cancer, hx/o thyroidectomy  . Vitamin d deficiency   . Family history of colon cancer     Past Surgical History  Procedure Date  . Thyroidectomy 01/2004    Dr. Johna Sheriff  . Colonoscopy 04/2005    Dr. Loreta Ave; screening due to family history    History   Social History  . Marital Status: Married    Spouse Name: N/A    Number of Children: N/A  . Years of Education: N/A   Occupational History  . Not on file.   Social History Main Topics  . Smoking status: Never Smoker   . Smokeless tobacco: Never Used  . Alcohol Use: No  . Drug Use: No  . Sexually Active: Not on file   Other Topics Concern  . Not on file   Social History Narrative  . No narrative on file    Family History  Problem Relation Age of Onset  . Cancer Mother     died of lung cancer, long term smoker, colon dx in 2s  . Thyroid disease Father   . Heart disease Father   . Cancer Maternal Aunt     colon  . Cancer Maternal Grandmother     colon  . Cancer Paternal Grandfather     skin cancer   Current Outpatient Prescriptions on File Prior to Visit  Medication Sig Dispense Refill  . levothyroxine  (SYNTHROID) 100 MCG tablet Take 1 tablet (100 mcg total) by mouth daily.  7 tablet  0  . loratadine (CLARITIN) 10 MG tablet Take 10 mg by mouth daily.      Marland Kitchen omeprazole (PRILOSEC OTC) 20 MG tablet Take 1 tablet (20 mg total) by mouth daily.  28 tablet  1   No Known Allergies  ROS:  Denies vomiting, diarrhea, skin rash, shortness of breath, or other concerns  PHYSICAL EXAM: BP 98/60  Pulse 68  Temp(Src) 97.9 F (36.6 C) (Oral)  Ht 5\' 3"  (1.6 m)  Wt 135 lb (61.236 kg)  BMI 23.91 kg/m2  LMP 04/24/2011 Well developed, pleasant female, with some throat-clearing and dry cough HEENT:  PERRL, EOMI, conjunctiva clear.  TM's and EAC's normal.  OP with cobblestoning posteriorly Sinuses--tender over L frontal sinus Neck: no lymphadenopathy or mass Heart: regular rate and rhythm without murmur Lungs: clear bilaterally Skin: no rash  ASSESSMENT/PLAN: 1. Frontal sinusitis  amoxicillin-clavulanate (AUGMENTIN) 875-125 MG per tablet   Sinusitis--L frontal  Decongestants (ie Sudafed), Expectorant (ie Mucinex or Robitussin) to thin out the phlegm.  You may continue to use cough suppressant as needed (DM--dextromethorphan, or other hydrocodone-containing syrup) Try sinus rinse kit or Neti-pot.  Keep head of  bed elevated

## 2011-06-26 ENCOUNTER — Telehealth: Payer: Self-pay | Admitting: Internal Medicine

## 2011-06-26 MED ORDER — LEVOTHYROXINE SODIUM 100 MCG PO TABS: 100.0000 ug | ORAL_TABLET | Freq: Every day | ORAL | Status: DC

## 2011-06-26 NOTE — Telephone Encounter (Signed)
Done

## 2011-07-29 ENCOUNTER — Ambulatory Visit (INDEPENDENT_AMBULATORY_CARE_PROVIDER_SITE_OTHER): Payer: 59 | Admitting: Family Medicine

## 2011-07-29 VITALS — BP 96/66 | HR 90 | Temp 98.7°F | Resp 16 | Ht 63.0 in | Wt 136.0 lb

## 2011-07-29 DIAGNOSIS — R229 Localized swelling, mass and lump, unspecified: Secondary | ICD-10-CM

## 2011-07-29 DIAGNOSIS — J020 Streptococcal pharyngitis: Secondary | ICD-10-CM

## 2011-07-29 DIAGNOSIS — R053 Chronic cough: Secondary | ICD-10-CM

## 2011-07-29 DIAGNOSIS — D229 Melanocytic nevi, unspecified: Secondary | ICD-10-CM

## 2011-07-29 DIAGNOSIS — R05 Cough: Secondary | ICD-10-CM

## 2011-07-29 DIAGNOSIS — J029 Acute pharyngitis, unspecified: Secondary | ICD-10-CM

## 2011-07-29 DIAGNOSIS — D239 Other benign neoplasm of skin, unspecified: Secondary | ICD-10-CM

## 2011-07-29 LAB — POCT RAPID STREP A (OFFICE): Rapid Strep A Screen: POSITIVE — AB

## 2011-07-29 MED ORDER — AMOXICILLIN 875 MG PO TABS: 875.0000 mg | ORAL_TABLET | Freq: Two times a day (BID) | ORAL | Status: DC

## 2011-07-29 NOTE — Progress Notes (Signed)
Subjective: Patient has had a little cough for a week or 2. However now she has a sore throat that started yesterday really. She does not have a history of a lot of allergies. No fever. She does feel ill with the sore throat.  Objective: TMs are normal. Throat erythematous and slightly edematous but no exudate. Neck supple without significant nodes. Chest is clear. Incidentally I noticed a fairly large skin tag behind her left ear in the hairline. She pointed out that is another one just above the left ear also. These are benign in appearance. Apparently her dermatologist as declined removing them, but they are a nuisance to her.  Assessment: Pharyngitis Cough Benign nevi in scalp  Plan: Strep screen Results for orders placed in visit on 07/29/11  POCT RAPID STREP A (OFFICE)      Component Value Range   Rapid Strep A Screen Positive (*) Negative

## 2011-07-29 NOTE — Patient Instructions (Signed)
Pharyngitis, Viral and Bacterial Pharyngitis is soreness (inflammation) or infection of the pharynx. It is also called a sore throat. CAUSES  Most sore throats are caused by viruses and are part of a cold. However, some sore throats are caused by strep and other bacteria. Sore throats can also be caused by post nasal drip from draining sinuses, allergies and sometimes from sleeping with an open mouth. Infectious sore throats can be spread from person to person by coughing, sneezing and sharing cups or eating utensils. TREATMENT  Sore throats that are viral usually last 3-4 days. Viral illness will get better without medications (antibiotics). Strep throat and other bacterial infections will usually begin to get better about 24-48 hours after you begin to take antibiotics. HOME CARE INSTRUCTIONS   If the caregiver feels there is a bacterial infection or if there is a positive strep test, they will prescribe an antibiotic. The full course of antibiotics must be taken. If the full course of antibiotic is not taken, you or your child may become ill again. If you or your child has strep throat and do not finish all of the medication, serious heart or kidney diseases may develop.   Drink enough water and fluids to keep your urine clear or pale yellow.   Only take over-the-counter or prescription medicines for pain, discomfort or fever as directed by your caregiver.   Get lots of rest.   Gargle with salt water ( tsp. of salt in a glass of water) as often as every 1-2 hours as you need for comfort.   Hard candies may soothe the throat if individual is not at risk for choking. Throat sprays or lozenges may also be used.  SEEK MEDICAL CARE IF:   Large, tender lumps in the neck develop.   A rash develops.   Green, yellow-brown or bloody sputum is coughed up.   Your baby is older than 3 months with a rectal temperature of 100.5 F (38.1 C) or higher for more than 1 day.  SEEK IMMEDIATE MEDICAL CARE  IF:   A stiff neck develops.   You or your child are drooling or unable to swallow liquids.   You or your child are vomiting, unable to keep medications or liquids down.   You or your child has severe pain, unrelieved with recommended medications.   You or your child are having difficulty breathing (not due to stuffy nose).   You or your child are unable to fully open your mouth.   You or your child develop redness, swelling, or severe pain anywhere on the neck.   You have a fever.   Your baby is older than 3 months with a rectal temperature of 102 F (38.9 C) or higher.   Your baby is 38 months old or younger with a rectal temperature of 100.4 F (38 C) or higher.  MAKE SURE YOU:   Understand these instructions.   Will watch your condition.   Will get help right away if you are not doing well or get worse.  Document Released: 02/18/2005 Document Revised: 02/07/2011 Document Reviewed: 05/18/2007 [facility] Patient Information 2012 [place], [place].      If you decide you want me to remove those moles from your head ever, call and leave a message with the team leader. I will set up a time to do that for you at the [address] building.

## 2011-07-31 ENCOUNTER — Encounter: Payer: Self-pay | Admitting: Internal Medicine

## 2011-08-02 ENCOUNTER — Encounter: Payer: 59 | Admitting: Medical

## 2011-08-06 ENCOUNTER — Encounter: Payer: Self-pay | Admitting: Medical

## 2011-08-06 ENCOUNTER — Ambulatory Visit (INDEPENDENT_AMBULATORY_CARE_PROVIDER_SITE_OTHER): Payer: 59 | Admitting: Medical

## 2011-08-06 VITALS — BP 100/70 | HR 75 | Temp 97.9°F | Resp 16 | Wt 137.0 lb

## 2011-08-06 DIAGNOSIS — K219 Gastro-esophageal reflux disease without esophagitis: Secondary | ICD-10-CM

## 2011-08-06 DIAGNOSIS — E039 Hypothyroidism, unspecified: Secondary | ICD-10-CM

## 2011-08-06 DIAGNOSIS — R748 Abnormal levels of other serum enzymes: Secondary | ICD-10-CM

## 2011-08-06 DIAGNOSIS — E559 Vitamin D deficiency, unspecified: Secondary | ICD-10-CM

## 2011-08-06 LAB — CBC
Hemoglobin: 13.5 g/dL (ref 12.0–15.0)
MCH: 31.3 pg (ref 26.0–34.0)
MCHC: 33.6 g/dL (ref 30.0–36.0)
Platelets: 322 10*3/uL (ref 150–400)
RDW: 12.9 % (ref 11.5–15.5)

## 2011-08-06 LAB — COMPREHENSIVE METABOLIC PANEL
AST: 33 U/L (ref 0–37)
BUN: 15 mg/dL (ref 6–23)
Calcium: 9.4 mg/dL (ref 8.4–10.5)
Chloride: 106 mEq/L (ref 96–112)
Creat: 0.8 mg/dL (ref 0.50–1.10)
Total Bilirubin: 0.6 mg/dL (ref 0.3–1.2)

## 2011-08-06 LAB — TSH: TSH: 1.079 u[IU]/mL (ref 0.350–4.500)

## 2011-08-06 NOTE — Progress Notes (Signed)
  Subjective:   HPI  Judy Mckenzie is a 46 y.o. female who presents with general med check and labs.   Is fasting today.  Here for repeat labs and refills on thyroid medication.  No c/o with this.  In general exercises 3 days per week, recently started the insanity workout.  Does Yoga, walking.  Eats healthy.  Not taking her Vit D or multivitamin though.  She sees gynecology yearly for pap/general gyn recheck.  No other new c/o.   The following portions of the patient's history were reviewed and updated as appropriate: allergies, current medications, past family history, past medical history, past social history, past surgical history and problem list.  Past Medical History  Diagnosis Date  . Hypothyroidism     hx/o thyroid cancer, hx/o thyroidectomy  . Vitamin d deficiency   . Family history of colon cancer     No Known Allergies   Review of Systems ROS reviewed and was negative other than noted in HPI or above.    Objective:   Physical Exam  General appearance: alert, no distress, WD/WN HEENT: normocephalic, sclerae anicteric, TMs pearly, nares patent, no discharge or erythema, pharynx normal Oral cavity: MMM, no lesions Neck: supple, no lymphadenopathy, no thyromegaly, no masses Heart: RRR, normal S1, S2, no murmurs Lungs: CTA bilaterally, no wheezes, rhonchi, or rales Abdomen: +bs, soft, non tender, non distended, no masses, no hepatomegaly, no splenomegaly Pulses: 2+ symmetric, upper and lower extremities, normal cap refill   Assessment and Plan :     Encounter Diagnoses  Name Primary?  . Hypothyroidism Yes  . Elevated liver enzymes   . Vitamin d deficiency   . GERD (gastroesophageal reflux disease)    Hypothyroidism - labs today, samples of Synthroid given.  Elevated LFTs - labs today  Vit D deficiency - advised daily Vit D OTC 2000 IU daily.  Gerd - mild, controlled with diet and PPI intermittent

## 2011-08-07 ENCOUNTER — Other Ambulatory Visit: Payer: Self-pay | Admitting: Medical

## 2011-08-07 ENCOUNTER — Telehealth: Payer: Self-pay | Admitting: Family Medicine

## 2011-08-07 MED ORDER — SYNTHROID 100 MCG PO TABS: 100.0000 ug | ORAL_TABLET | Freq: Every day | ORAL | Status: DC

## 2011-08-07 NOTE — Telephone Encounter (Signed)
Let her know that I wrote for name brand Synthroid.  Let me know if any issue at the pharmacy.

## 2011-08-08 NOTE — Telephone Encounter (Signed)
PATIENT STATES THAT SHE WANTS TO SPEAK WITH YOU ABOUT A FEW THINGS, SHE STATES THAT HER DAD SIDE OF THE FAMILY HAS SUFFERED FROM  HEART ATTACKS. SHE SAID WITH ALL OF THE ON GOING SX SHE HAS BEEN HAVING, THE PERSISTANT COUGH, ELEVATED LIVER TEST AND THE ELEVATED WBC. DO YOU THINK THIS HAS ANYTHING TO DO WITH HEART DIEASE. PATIENT IS VERY CONCERNED AND WANTS TO SPEAK WITH YOU. CLS    PATIENT WAS INFORMED THAT HER MEDICATION WAS SENT TO THE PHARMACY. CLS

## 2011-08-13 NOTE — Telephone Encounter (Signed)
I called pt and left msg for call back

## 2011-08-16 ENCOUNTER — Telehealth: Payer: Self-pay | Admitting: Medical

## 2011-08-16 NOTE — Telephone Encounter (Signed)
I spoke to pt.  Given her cardiac risk factors - father and brother with history of heart disease, she wants to be sure her heart is ok.  I reviewed last lipids and lab results.  At thinks point she wants to come in for EKG, and further cardiac testing. I recommended Lipo profile testing, high sensitivity CRP, Homocysteine, and EKG.  Overall her only risks factors are family history . She doesn't smoke, exercises, eats healthy, has normal weight and BP.

## 2011-08-22 NOTE — Telephone Encounter (Signed)
TSD  

## 2012-04-17 ENCOUNTER — Other Ambulatory Visit: Payer: Self-pay | Admitting: Medical

## 2012-07-01 ENCOUNTER — Ambulatory Visit (INDEPENDENT_AMBULATORY_CARE_PROVIDER_SITE_OTHER): Payer: BC Managed Care – PPO | Admitting: Diagnostic Neuroimaging

## 2012-07-01 ENCOUNTER — Encounter: Payer: Self-pay | Admitting: Diagnostic Neuroimaging

## 2012-07-01 VITALS — BP 99/68 | HR 78 | Temp 98.4°F | Ht 64.0 in | Wt 144.0 lb

## 2012-07-01 DIAGNOSIS — R519 Headache, unspecified: Secondary | ICD-10-CM | POA: Insufficient documentation

## 2012-07-01 DIAGNOSIS — R51 Headache: Secondary | ICD-10-CM | POA: Insufficient documentation

## 2012-07-01 DIAGNOSIS — G514 Facial myokymia: Secondary | ICD-10-CM | POA: Insufficient documentation

## 2012-07-01 DIAGNOSIS — G518 Other disorders of facial nerve: Secondary | ICD-10-CM

## 2012-07-01 NOTE — Patient Instructions (Signed)
We will order MRI brain without contrast.

## 2012-07-01 NOTE — Progress Notes (Signed)
[facility]  PATIENT: Judy Mckenzie DOB: 1965/03/27  REFERRING CLINICIAN: Dr. [facility] HISTORY FROM:patient REASON FOR VISIT: right eye twitching and puffiness.   HISTORICAL  CHIEF COMPLAINT:  Chief Complaint  Patient presents with  . 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  January 2014 patient looked in the mirror noticed that there is some slight swelling and puffiness of her right upper eyelid and periorbital region. Patient thought this might be age-related changing. Patient was evaluated by dermatology and primary care, and referral to neurology was recommended. Of note patient has a chronic asymmetry of her right lower face, with right perioral region slightly lower than the left side. She's had this since birth.  Also patient began to have twitching in the right eyelid, lasting for 3 weeks. This subsided 2 weeks ago.  Patient also has some intermittent twitches and right sided headaches in the right parietal region.  No significant triggering aggravating factors. No recent changes in diet, stress, sleep, caffeine.   REVIEW OF SYSTEMS: Full 14 system review of systems performed and notable only for headache, dizziness, palpitation, weight gain.  ALLERGIES: No Known Allergies  HOME MEDICATIONS: Outpatient Prescriptions Prior to Visit  Medication Sig Dispense Refill  . SYNTHROID 100 MCG tablet TAKE 1 TABLET BY MOUTH DAILY  30 tablet  0  . loratadine (CLARITIN) 10 MG tablet Take 10 mg by mouth daily.      Marland Kitchen omeprazole (PRILOSEC OTC) 20 MG tablet Take 1 tablet (20 mg total) by mouth daily.  28 tablet  1   No  facility-administered medications prior to visit.    PAST MEDICAL HISTORY: Past Medical History  Diagnosis Date  . Hypothyroidism     hx/o thyroid cancer, hx/o thyroidectomy  . Vitamin D deficiency   . Family history of colon cancer     PAST SURGICAL HISTORY: Past Surgical History  Procedure Laterality Date  . Thyroidectomy  01/2004    Dr. Johna Sheriff  . Colonoscopy  04/2005    Dr. Loreta Ave; screening due to family history    FAMILY HISTORY: Family History  Problem Relation Age of Onset  . Cancer Mother     died of lung cancer, long term smoker, colon dx in 29s  . Thyroid disease Father   . Heart disease Father   . Cancer Maternal Aunt     colon  . Cancer Maternal Grandmother     colon  . Cancer Paternal Grandfather     skin cancer    SOCIAL HISTORY:  History   Social History  . Marital Status: Married    Spouse Name: N/A    Number of Children: 1  . Years of Education: BA   Occupational History  .     Social History Main Topics  . Smoking status: Never Smoker   . Smokeless tobacco: Never Used  . Alcohol Use: Yes     Comment: patient has 1 beer/wine a month.Marland KitchenMarland KitchenPatient also drinks 3 cups of coffee daily.  . Drug Use: No  . Sexually Active: Not on file   Other Topics Concern  . Not on file   Social History Narrative  . No narrative on file  PHYSICAL EXAM  Filed Vitals:   07/01/12 1011  BP: 99/68  Pulse: 78  Temp: 98.4 F (36.9 C)  TempSrc: Oral  Height: 5\' 4"  (1.626 m)  Weight: 144 lb (65.318 kg)   Body mass index is 24.71 kg/(m^2).  GENERAL EXAM: Patient is in no distress  CARDIOVASCULAR: Regular rate and rhythm, no murmurs, no carotid bruits  NEUROLOGIC: MENTAL STATUS: awake, alert, language fluent, comprehension intact, naming intact CRANIAL NERVE: no papilledema on fundoscopic exam, pupils equal and reactive to light, visual fields full to confrontation, extraocular muscles intact, no nystagmus, facial sensation and strength symmetric,  uvula midline, shoulder shrug symmetric, tongue midline.  SLIGHT DROOP OF RIGHT SIDE OF MOUTH MOTOR: normal bulk and tone, full strength in the BUE, BLE SENSORY: normal and symmetric to light touch, pinprick, temperature, vibration and proprioception COORDINATION: finger-nose-finger, fine finger movements normal REFLEXES: deep tendon reflexes present and symmetric GAIT/STATION: narrow based gait; able to walk on toes, heels and tandem; romberg is negative   DIAGNOSTIC DATA (LABS, IMAGING, TESTING) - I reviewed patient records, labs, notes, testing and imaging myself where available.  Lab Results  Component Value Date   WBC 3.3* 08/06/2011   HGB 13.5 08/06/2011   HCT 40.2 08/06/2011   MCV 93.3 08/06/2011   PLT 322 08/06/2011      Component Value Date/Time   NA 140 08/06/2011 1004   K 4.5 08/06/2011 1004   CL 106 08/06/2011 1004   CO2 26 08/06/2011 1004   GLUCOSE 84 08/06/2011 1004   BUN 15 08/06/2011 1004   CREATININE 0.80 08/06/2011 1004   CREATININE 0.67 02/11/2007 1312   CALCIUM 9.4 08/06/2011 1004   PROT 6.8 08/06/2011 1004   ALBUMIN 4.1 08/06/2011 1004   AST 33 08/06/2011 1004   ALT 44* 08/06/2011 1004   ALKPHOS 80 08/06/2011 1004   BILITOT 0.6 08/06/2011 1004   No results found for this basename: CHOL,  HDL,  LDLCALC,  LDLDIRECT,  TRIG,  CHOLHDL   No results found for this basename: HGBA1C   Lab Results  Component Value Date   VITAMINB12 608 12/17/2006   Lab Results  Component Value Date   TSH 1.079 08/06/2011     ASSESSMENT AND PLAN  47 y.o. year old right-handed female  has a past medical history of Hypothyroidism; Vitamin D deficiency; and Family history of colon cancer. here with right eye twitching x three weeks and upper eyelid puffiness. Unclear etiology. Patient also having right-sided headaches. I will check MRI of the brain for further evaluation.  PLAN: 1. MRI brain   Suanne Marker, MD 07/01/2012, 5:43 PM (with assistance from [facility] NP-C) Certified in Neurology,  Neurophysiology and Neuroimaging  [facility] [address] [place], [place] [address] [phone]

## 2012-07-15 ENCOUNTER — Other Ambulatory Visit: Payer: BC Managed Care – PPO

## 2012-08-12 ENCOUNTER — Other Ambulatory Visit: Payer: BC Managed Care – PPO

## 2013-12-05 IMAGING — CR DG CHEST 2V
2 series · 2 of 2 positions shown · non-contrast
Comparison: Chest x-ray of 03/27/2010

CLINICAL DATA: Cough for 2 months, right-sided chest pain,
congestion

CHEST - 2 VIEW

[view not recorded (1 of 2)]
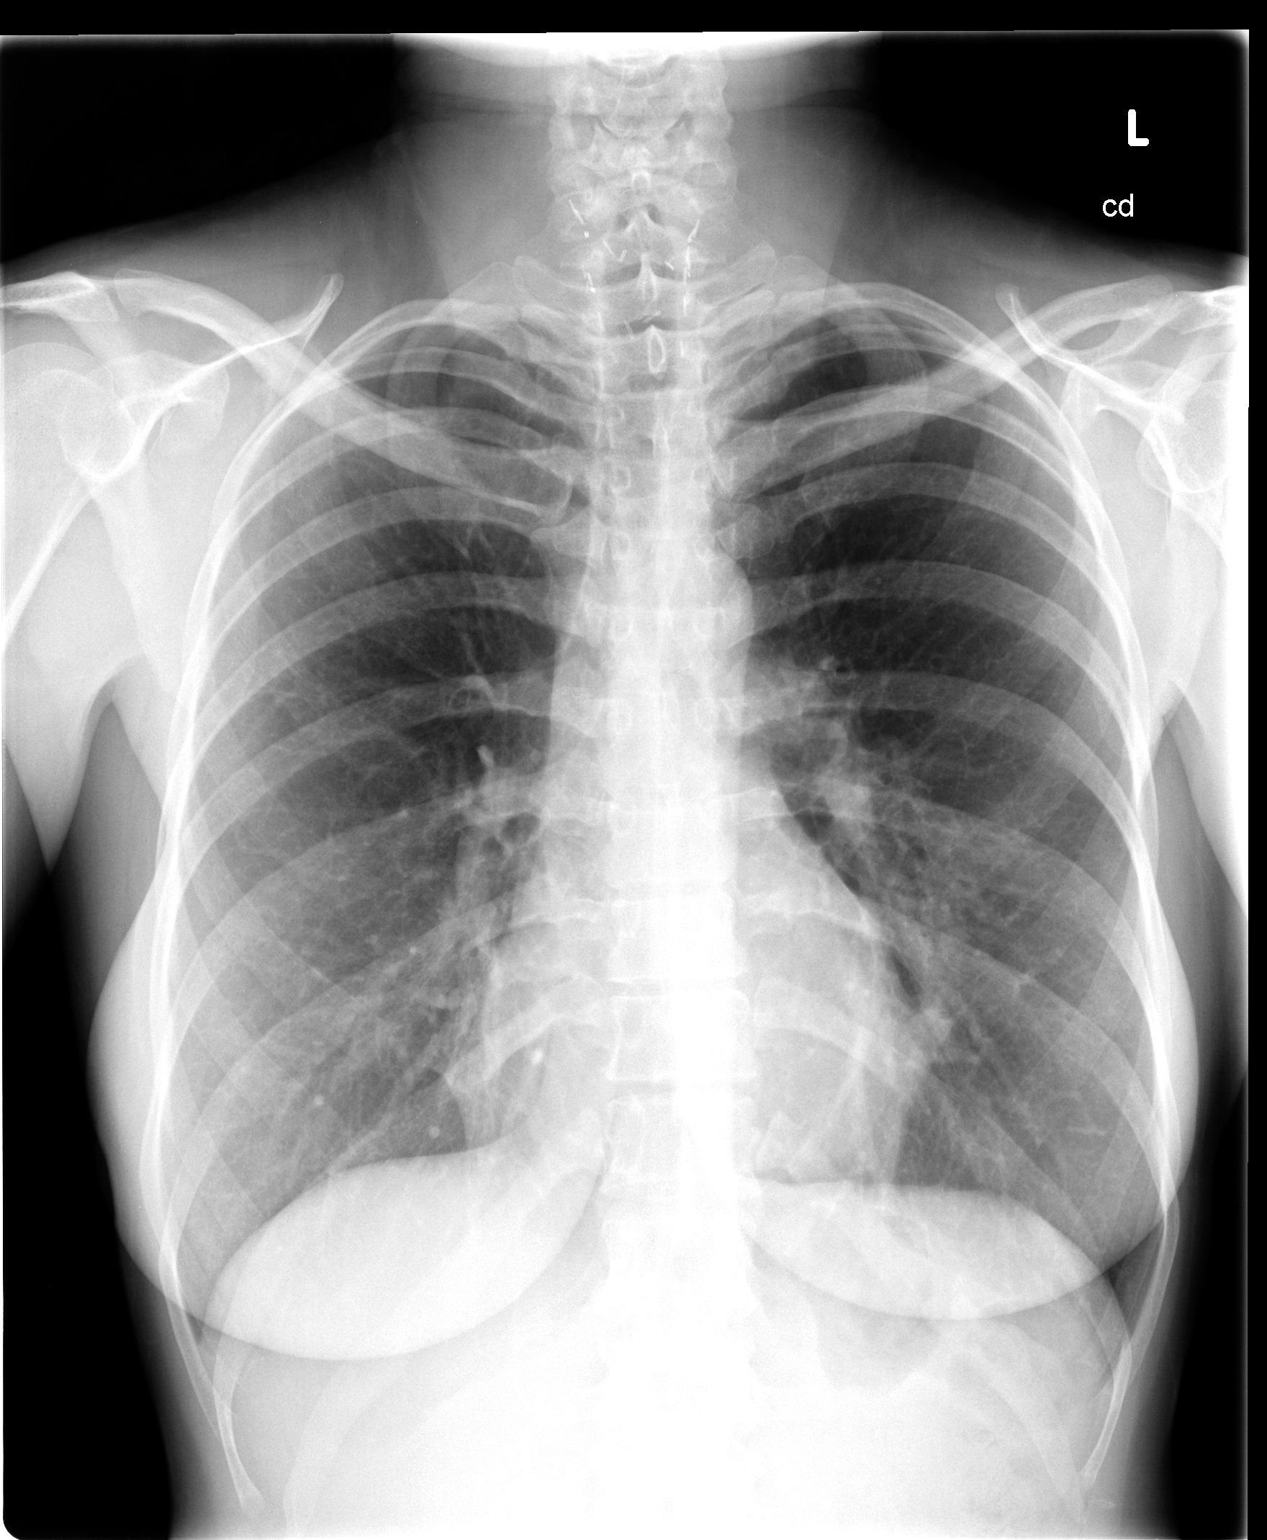

[view not recorded (2 of 2)]
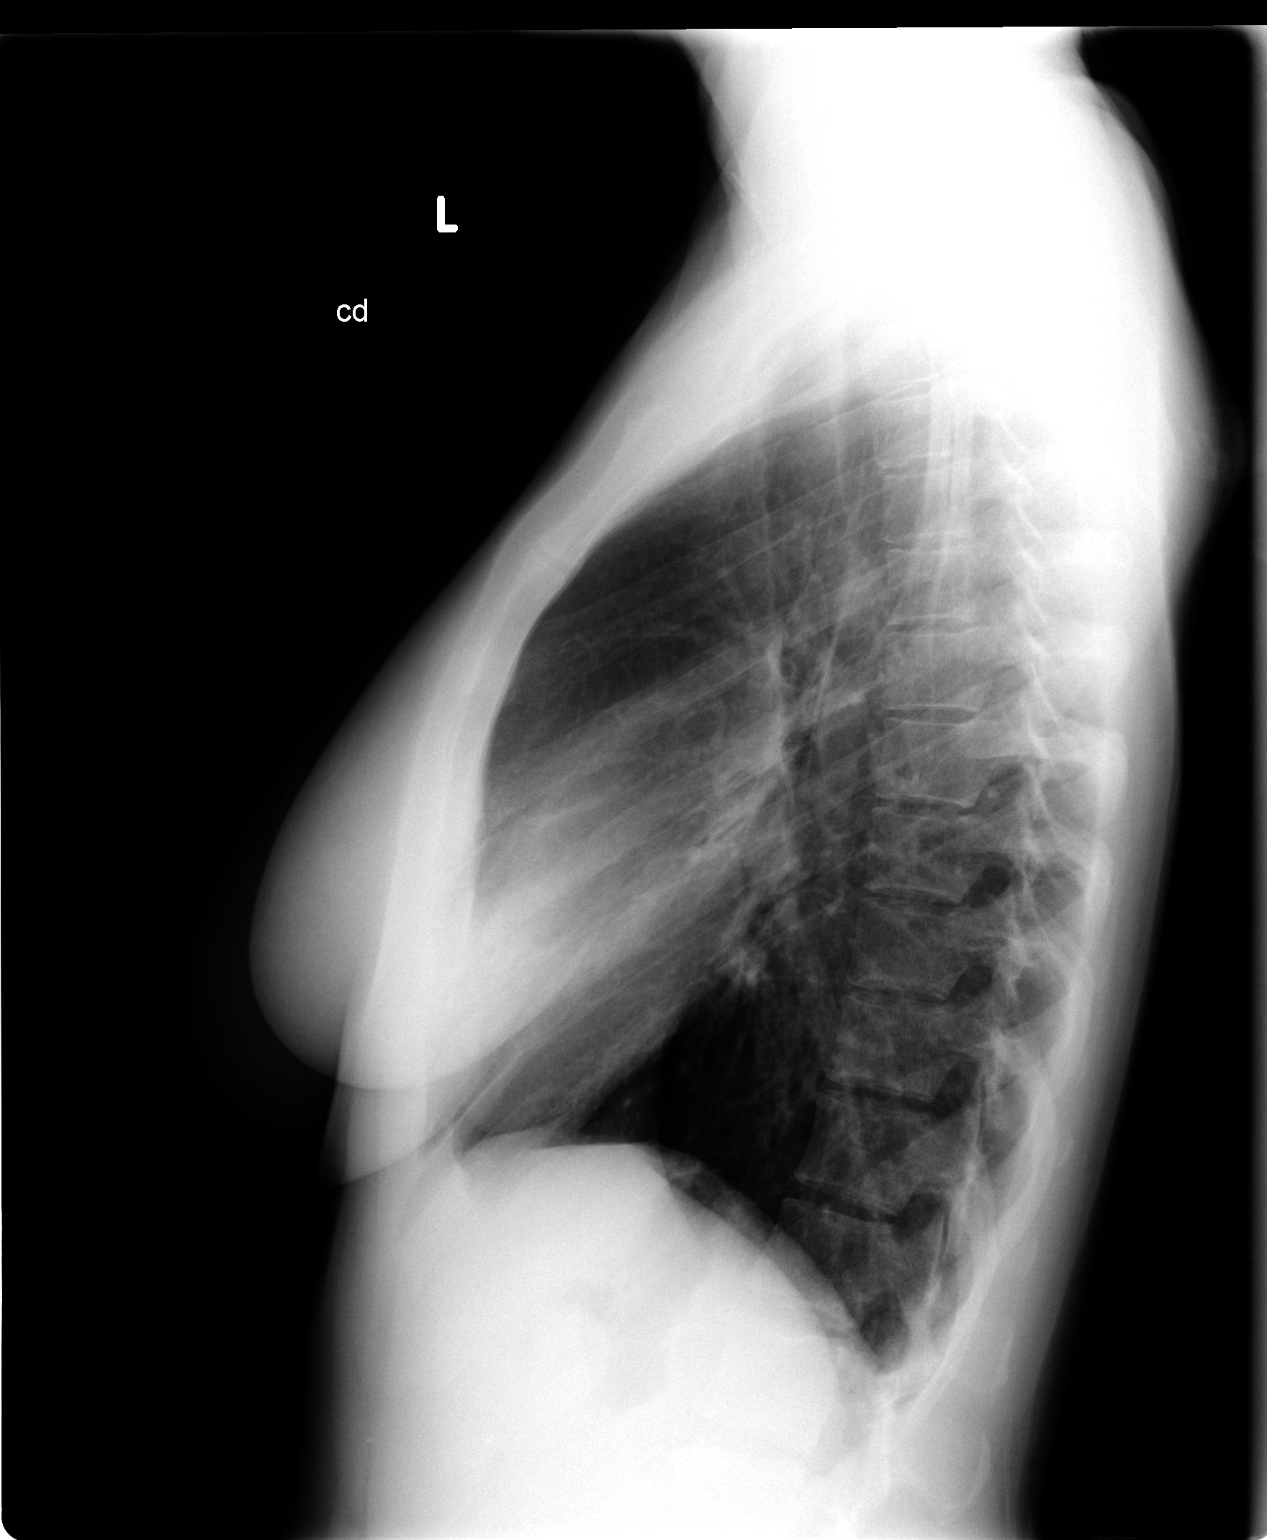

[2 of 2 positions shown; findings below may reference images not displayed]

FINDINGS: No active infiltrate or effusion is seen.  Mediastinal
contours are stable.  The heart is within normal limits in size.
No bony abnormality is seen.  Surgical clips overlie the lower neck
in this patient who has undergone thyroidectomy for thyroid
carcinoma previously.
IMPRESSION: Stable chest x-ray.  No active lung disease.

## 2014-10-07 ENCOUNTER — Other Ambulatory Visit: Payer: Self-pay | Admitting: Physician Assistant

## 2016-01-17 ENCOUNTER — Other Ambulatory Visit: Payer: Self-pay | Admitting: Internal Medicine

## 2016-01-17 DIAGNOSIS — Z1231 Encounter for screening mammogram for malignant neoplasm of breast: Secondary | ICD-10-CM

## 2016-02-12 ENCOUNTER — Ambulatory Visit: Payer: Self-pay

## 2016-02-16 ENCOUNTER — Ambulatory Visit
Admission: RE | Admit: 2016-02-16 | Discharge: 2016-02-16 | Disposition: A | Discharge status: Home / Self Care | Payer: 59 | Source: Ambulatory Visit | Attending: Internal Medicine | Admitting: Internal Medicine

## 2016-02-16 DIAGNOSIS — Z1231 Encounter for screening mammogram for malignant neoplasm of breast: Secondary | ICD-10-CM

## 2017-10-14 ENCOUNTER — Other Ambulatory Visit: Payer: Self-pay | Admitting: Physician Assistant

## 2018-08-11 ENCOUNTER — Encounter: Payer: Self-pay | Admitting: Neurology

## 2018-09-11 ENCOUNTER — Telehealth: Payer: Self-pay

## 2018-09-11 NOTE — Telephone Encounter (Signed)
Called and LMOVM advising Pt I wanted to confirm information prior to her 7:50 am appointment 09/14/18

## 2018-09-14 ENCOUNTER — Encounter: Payer: Self-pay | Admitting: Neurology

## 2018-09-14 ENCOUNTER — Other Ambulatory Visit: Payer: Self-pay

## 2018-09-14 ENCOUNTER — Telehealth (INDEPENDENT_AMBULATORY_CARE_PROVIDER_SITE_OTHER): Payer: 59 | Admitting: Neurology

## 2018-09-14 VITALS — Ht 63.0 in | Wt 159.0 lb

## 2018-09-14 DIAGNOSIS — Q670 Congenital facial asymmetry: Secondary | ICD-10-CM | POA: Diagnosis not present

## 2018-09-14 DIAGNOSIS — R519 Headache, unspecified: Secondary | ICD-10-CM

## 2018-09-14 DIAGNOSIS — R51 Headache: Secondary | ICD-10-CM

## 2018-09-14 NOTE — Progress Notes (Signed)
Virtual Visit via Video Note The purpose of this virtual visit is to provide medical care while limiting exposure to the novel coronavirus.    Consent was obtained for video visit:  Yes Answered questions that patient had about telehealth interaction:  Yes I discussed the limitations, risks, security and privacy concerns of performing an evaluation and management service by telemedicine. I also discussed with the patient that there may be a patient responsible charge related to this service. The patient expressed understanding and agreed to proceed.  Pt location: Home Physician Location: Home Name of referring provider:  Prince Solian, MD I connected with Konrad Dolores at patients initiation/request on 09/14/2018 at  7:50 AM EDT by video enabled telemedicine application and verified that I am speaking with the correct person using two identifiers. Pt MRN:  [id number] Pt DOB:  1965/05/03 Video Participants:  Konrad Dolores   History of Present Illness:  Judy Mckenzie is a 53 year old woman with acquired hypothyroidism s/p thyroidectomy for thyroid cancer who presents for facial paresthesias.    Since she was a little girl, the right side of her lip looked like it would droop according to photographs.  4 or 5 years ago, she noticed a change.  The right eye seemed a little different.  They eye was twitching a lot at that time.  Then over the last year and a half, the right side of her mouth seems more prominently droopy, even while speaking.  No slurred speech or difficulty enunciating.  No numbness or tingling of the face.  Right side of face appears maybe slightly puffy.  No change in sweating in right side of face compared to the left side.  No changes in flushing.  Sometimes she reports unilateral headaches, recently right frontal, mild slight throbbing, lasting less than 2 hours and occurs once every week to two weeks.  No associated nausea, vomiting, photophobia, phonophobia,  worsening right sided facial weakness.  Pressing on her forehead helps relieving.  Past Medical History: Past Medical History:  Diagnosis Date   Family history of colon cancer    Hypothyroidism    hx/o thyroid cancer, hx/o thyroidectomy   Vitamin D deficiency     Medications: Outpatient Encounter Medications as of 09/14/2018  Medication Sig   levothyroxine (SYNTHROID) 112 MCG tablet Take 112 mcg by mouth daily.   [DISCONTINUED] SYNTHROID 100 MCG tablet TAKE 1 TABLET BY MOUTH DAILY   No facility-administered encounter medications on file as of 09/14/2018.     Allergies: No Known Allergies  Family History: Family History  Problem Relation Age of Onset   Cancer Mother        died of lung cancer, long term smoker, colon dx in 32s   Thyroid disease Father    Heart disease Father    Cancer Maternal Aunt        colon   Cancer Maternal Grandmother        colon   Cancer Paternal Grandfather        skin cancer    Social History: Social History   Socioeconomic History   Marital status: Married    Spouse name: [name]   Number of children: 1   Years of education: BA   Highest education level: Bachelor's degree (e.g., BA, AB, BS)  Occupational History    Employer: NOT EMPLOYED  Social Designer, fashion/clothing strain: Not on file   Food insecurity    Worry: Not on file    Inability:  Not on file   Transportation needs    Medical: Not on file    Non-medical: Not on file  Tobacco Use   Smoking status: Never Smoker   Smokeless tobacco: Never Used  Substance and Sexual Activity   Alcohol use: Yes    Comment: patient has 1 beer/wine a month.Marland KitchenMarland KitchenPatient also drinks 3 cups of coffee daily.   Drug use: No   Sexual activity: Not on file  Lifestyle   Physical activity    Days per week: Not on file    Minutes per session: Not on file   Stress: Not on file  Relationships   Social connections    Talks on phone: Not on file    Gets together: Not on  file    Attends religious service: Not on file    Active member of club or organization: Not on file    Attends meetings of clubs or organizations: Not on file    Relationship status: Not on file   Intimate partner violence    Fear of current or ex partner: Not on file    Emotionally abused: Not on file    Physically abused: Not on file    Forced sexual activity: Not on file  Other Topics Concern   Not on file  Social History Narrative   Not on file    Observations/Objective:   Height 5\' 3"  (1.6 m), weight 159 lb (72.1 kg). No acute distress.  Alert and oriented.  Speech fluent and not dysarthric.  Language intact.  Eyes orthophoric on primary gaze.  Slight drooping of right side of lip.  No ptosis.  Reports sensation intact bilaterally when she touches her face.  Assessment and Plan:   1.  Facial asymmetry, with mild right sided facial weakness.  Unclear if it is her normal face and possibly appears more pronounced due to changes with aging.  No sensory/autonomic symptoms or asymmetry.  She does have headaches but I don't think they are migraine-related symptoms 2.  Headache, possibly just tension type headache.  1.  We will check MRI of brain with and without contrast.  She will see what the cost for her will be and decide if she will defer at this time.  If she does defer MRI, then she should monitor and if symptoms get worse, we can always reorder.  Follow Up Instructions:    -I discussed the assessment and treatment plan with the patient. The patient was provided an opportunity to ask questions and all were answered. The patient agreed with the plan and demonstrated an understanding of the instructions.   The patient was advised to call back or seek an in-person evaluation if the symptoms worsen or if the condition fails to improve as anticipated.    Total Time spent in visit with the patient was:  40 minutes   Dudley Major, DO

## 2021-04-19 ENCOUNTER — Other Ambulatory Visit: Payer: Self-pay | Admitting: Internal Medicine

## 2021-04-19 DIAGNOSIS — E785 Hyperlipidemia, unspecified: Secondary | ICD-10-CM

## 2021-05-01 ENCOUNTER — Other Ambulatory Visit: Payer: Self-pay | Admitting: Internal Medicine

## 2021-05-01 DIAGNOSIS — Z1231 Encounter for screening mammogram for malignant neoplasm of breast: Secondary | ICD-10-CM

## 2022-05-15 ENCOUNTER — Other Ambulatory Visit: Payer: Self-pay | Admitting: Internal Medicine

## 2022-05-15 DIAGNOSIS — Z1231 Encounter for screening mammogram for malignant neoplasm of breast: Secondary | ICD-10-CM

## 2022-05-16 ENCOUNTER — Other Ambulatory Visit: Payer: Self-pay | Admitting: Internal Medicine

## 2022-05-16 DIAGNOSIS — E785 Hyperlipidemia, unspecified: Secondary | ICD-10-CM

## 2022-05-20 ENCOUNTER — Ambulatory Visit: Payer: 59

## 2022-05-23 ENCOUNTER — Ambulatory Visit
Admission: RE | Admit: 2022-05-23 | Discharge: 2022-05-23 | Disposition: A | Discharge status: Home / Self Care | Payer: 59 | Source: Ambulatory Visit | Attending: Internal Medicine | Admitting: Internal Medicine

## 2022-05-23 DIAGNOSIS — Z1231 Encounter for screening mammogram for malignant neoplasm of breast: Secondary | ICD-10-CM

## 2023-05-22 ENCOUNTER — Other Ambulatory Visit: Payer: Self-pay | Admitting: Internal Medicine

## 2023-05-22 DIAGNOSIS — Z1231 Encounter for screening mammogram for malignant neoplasm of breast: Secondary | ICD-10-CM
